# Patient Record
Sex: Male | Born: 1980 | Hispanic: No | Marital: Married
Health system: Southern US, Community
[De-identification: ages and names within clinical notes are randomized; demographics above are authoritative.]

---

## 2003-07-27 ENCOUNTER — Ambulatory Visit (HOSPITAL_COMMUNITY): Admission: RE | Admit: 2003-07-27 | Discharge: 2003-07-27 | Payer: Self-pay | Admitting: Urology

## 2021-02-18 ENCOUNTER — Other Ambulatory Visit: Payer: Self-pay

## 2021-02-18 ENCOUNTER — Emergency Department (HOSPITAL_COMMUNITY)
Admission: EM | Admit: 2021-02-18 | Discharge: 2021-02-19 | Disposition: A | Discharge status: Home / Self Care | Payer: Self-pay | Attending: Emergency Medicine | Admitting: Emergency Medicine

## 2021-02-18 ENCOUNTER — Encounter (HOSPITAL_COMMUNITY): Payer: Self-pay | Admitting: Emergency Medicine

## 2021-02-18 DIAGNOSIS — R1013 Epigastric pain: Secondary | ICD-10-CM | POA: Insufficient documentation

## 2021-02-18 DIAGNOSIS — R0789 Other chest pain: Secondary | ICD-10-CM | POA: Insufficient documentation

## 2021-02-18 DIAGNOSIS — R109 Unspecified abdominal pain: Secondary | ICD-10-CM

## 2021-02-18 DIAGNOSIS — Z20822 Contact with and (suspected) exposure to covid-19: Secondary | ICD-10-CM | POA: Insufficient documentation

## 2021-02-18 LAB — URINALYSIS, ROUTINE W REFLEX MICROSCOPIC
Bilirubin Urine: NEGATIVE
Glucose, UA: NEGATIVE mg/dL
Hgb urine dipstick: NEGATIVE
Ketones, ur: NEGATIVE mg/dL
Leukocytes,Ua: NEGATIVE
Nitrite: NEGATIVE
Protein, ur: NEGATIVE mg/dL
Specific Gravity, Urine: 1.011 (ref 1.005–1.030)
pH: 6 (ref 5.0–8.0)

## 2021-02-18 NOTE — ED Triage Notes (Signed)
Pt c/o right sided flank pain since Friday, worsening today. Denies nausea/vomiting. States he was seen by his doctor on Friday, given meds, with no relief. Denies urinary symptoms.

## 2021-02-19 ENCOUNTER — Emergency Department (HOSPITAL_COMMUNITY): Payer: Self-pay

## 2021-02-19 LAB — CBC WITH DIFFERENTIAL/PLATELET
Abs Immature Granulocytes: 0.04 10*3/uL (ref 0.00–0.07)
Basophils Absolute: 0 10*3/uL (ref 0.0–0.1)
Basophils Relative: 0 %
Eosinophils Absolute: 0 10*3/uL (ref 0.0–0.5)
Eosinophils Relative: 0 %
HCT: 42.3 % (ref 39.0–52.0)
Hemoglobin: 14.3 g/dL (ref 13.0–17.0)
Immature Granulocytes: 0 %
Lymphocytes Relative: 12 %
Lymphs Abs: 1.1 10*3/uL (ref 0.7–4.0)
MCH: 31.1 pg (ref 26.0–34.0)
MCHC: 33.8 g/dL (ref 30.0–36.0)
MCV: 92 fL (ref 80.0–100.0)
Monocytes Absolute: 0.4 10*3/uL (ref 0.1–1.0)
Monocytes Relative: 4 %
Neutro Abs: 7.5 10*3/uL (ref 1.7–7.7)
Neutrophils Relative %: 84 %
Platelets: 222 10*3/uL (ref 150–400)
RBC: 4.6 MIL/uL (ref 4.22–5.81)
RDW: 12.1 % (ref 11.5–15.5)
WBC: 9 10*3/uL (ref 4.0–10.5)
nRBC: 0 % (ref 0.0–0.2)

## 2021-02-19 LAB — COMPREHENSIVE METABOLIC PANEL
ALT: 18 U/L (ref 0–44)
AST: 16 U/L (ref 15–41)
Albumin: 4.2 g/dL (ref 3.5–5.0)
Alkaline Phosphatase: 38 U/L (ref 38–126)
Anion gap: 7 (ref 5–15)
BUN: 13 mg/dL (ref 6–20)
CO2: 24 mmol/L (ref 22–32)
Calcium: 9 mg/dL (ref 8.9–10.3)
Chloride: 105 mmol/L (ref 98–111)
Creatinine, Ser: 0.79 mg/dL (ref 0.61–1.24)
GFR, Estimated: >60 mL/min (ref >60)
Glucose, Bld: 111 mg/dL — ABNORMAL HIGH (ref 70–99)
Potassium: 4.1 mmol/L (ref 3.5–5.1)
Sodium: 136 mmol/L (ref 135–145)
Total Bilirubin: 0.5 mg/dL (ref 0.3–1.2)
Total Protein: 7.2 g/dL (ref 6.5–8.1)

## 2021-02-19 LAB — RESP PANEL BY RT-PCR (FLU A&B, COVID) ARPGX2
Influenza A by PCR: NEGATIVE
Influenza B by PCR: NEGATIVE
SARS Coronavirus 2 by RT PCR: NEGATIVE

## 2021-02-19 LAB — LIPASE, BLOOD: Lipase: 26 U/L (ref 11–51)

## 2021-02-19 MED ORDER — ACETAMINOPHEN 325 MG PO TABS: 650.0000 mg | ORAL_TABLET | Freq: Four times a day (QID) | ORAL | 0 refills | Status: AC | PRN

## 2021-02-19 MED ORDER — IBUPROFEN 600 MG PO TABS: 600.0000 mg | ORAL_TABLET | Freq: Four times a day (QID) | ORAL | 0 refills | Status: AC | PRN

## 2021-02-19 MED ORDER — IOHEXOL 300 MG/ML  SOLN
75.0000 mL | Freq: Once | INTRAMUSCULAR | Status: AC | PRN
  Administered 2021-02-19: 75 mL via INTRAVENOUS

## 2021-02-19 MED ORDER — LIDOCAINE-PRILOCAINE 2.5-2.5 % EX CREA: 1.0000 "application " | TOPICAL_CREAM | Freq: Two times a day (BID) | CUTANEOUS | 0 refills | Status: AC | PRN

## 2021-02-19 MED ORDER — HYDROMORPHONE HCL 1 MG/ML IJ SOLN
1.0000 mg | Freq: Once | INTRAMUSCULAR | Status: AC
Start: 2021-02-19 — End: 2021-02-19
  Administered 2021-02-19: 1 mg via INTRAVENOUS
  Filled 2021-02-19: qty 1

## 2021-02-19 MED ORDER — OXYCODONE HCL 5 MG PO TABS: 5.0000 mg | ORAL_TABLET | Freq: Four times a day (QID) | ORAL | 0 refills | Status: AC | PRN

## 2021-02-19 MED ORDER — HYDROMORPHONE HCL 1 MG/ML IJ SOLN
1.0000 mg | Freq: Once | INTRAMUSCULAR | Status: AC
  Administered 2021-02-19: 1 mg via INTRAVENOUS
  Filled 2021-02-19: qty 1

## 2021-02-19 NOTE — Consult Note (Signed)
Surgical Evaluation Requesting provider: Dr. Renaye Rakers  Chief Complaint: right upper quadrant pain  HPI: Very pleasant and otherwise healthy 40yo man with 3 day history of right upper quadrant/lower rib pain. This began Friday morning and has persisted despite getting some steroid/pain med injections in his hips.  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  Describes it as a sharp crampy pain.  It is located over the right anterior and anterior lateral lower chest wall and along the lower edge of the rib cage on the right side.  Denies any back pain, does not really radiate anywhere else.  Denies any nausea, decreased appetite, change in bowel movements, or exacerbation of the pain with eating.      No Known Allergies  History reviewed. No pertinent past medical history.  History reviewed. No pertinent surgical history.  No family history on file.  Social History   Socioeconomic History  . Marital status: Married    Spouse name: Not on file  . Number of children: Not on file  . Years of education: Not on file  . Highest education level: Not on file  Occupational History  . Not on file  Tobacco Use  . Smoking status: Not on file  . Smokeless tobacco: Not on file  Substance and Sexual Activity  . Alcohol use: Not on file  . Drug use: Not on file  . Sexual activity: Not on file  Other Topics Concern  . Not on file  Social History Narrative  . Not on file   Social Determinants of Health   Financial Resource Strain: Not on file  Food Insecurity: Not on file  Transportation Needs: Not on file  Physical Activity: Not on file  Stress: Not on file  Social Connections: Not on file    No current facility-administered medications on file prior to encounter.   No current outpatient medications on file prior to  encounter.    Review of Systems: a complete, 10pt review of systems was completed with pertinent positives and negatives as documented in the HPI  Physical Exam: Vitals:   02/18/21 2221 02/19/21 0409  BP: 138/90 122/80  Pulse: 66 (!) 52  Resp: 18 16  Temp: 98.5 F (36.9 C) 97.8 F (36.6 C)  SpO2: 98% 99%   Gen: A&Ox3, no distress  Eyes: lids and conjunctivae normal, no icterus. Pupils equally round and reactive to light.  Neck: supple without mass or thyromegaly Chest: respiratory effort is normal. No crepitus or tenderness on palpation of the chest. Breath sounds equal.  Cardiovascular: RRR with palpable distal pulses, no pedal edema Gastrointestinal: soft, nondistended, nontender.  There is moderate tenderness over the lowest ribs and subcostal margin on the right side. no mass, hepatomegaly or splenomegaly. No hernia. Lymphatic: no lymphadenopathy in the neck or groin Muscoloskeletal: no clubbing or cyanosis of the fingers.  Strength is symmetrical throughout.  Range of motion of bilateral upper and lower extremities normal without pain, crepitation or contracture. Neuro: cranial nerves grossly intact.  Sensation intact to light touch diffusely. Psych: appropriate mood and affect, normal insight/judgment intact  Skin: warm and dry   CBC Latest Ref Rng & Units 02/19/2021  WBC 4.0 - 10.5 K/uL 9.0  Hemoglobin 13.0 - 17.0 g/dL 16.1  Hematocrit 09.6 - 52.0 % 42.3  Platelets 150 - 400 K/uL  222    CMP Latest Ref Rng & Units 02/19/2021  Glucose 70 - 99 mg/dL 244(W)  BUN 6 - 20 mg/dL 13  Creatinine 1.02 - 7.25 mg/dL 3.66  Sodium 440 - 347 mmol/L 136  Potassium 3.5 - 5.1 mmol/L 4.1  Chloride 98 - 111 mmol/L 105  CO2 22 - 32 mmol/L 24  Calcium 8.9 - 10.3 mg/dL 9.0  Total Protein 6.5 - 8.1 g/dL 7.2  Total Bilirubin 0.3 - 1.2 mg/dL 0.5  Alkaline Phos 38 - 126 U/L 38  AST 15 - 41 U/L 16  ALT 0 - 44 U/L 18    No results found for: INR, PROTIME  Imaging: CT ABDOMEN PELVIS W  CONTRAST  Result Date: 02/19/2021 CLINICAL DATA:  40 year old male with abdominal pain. EXAM: CT ABDOMEN AND PELVIS WITH CONTRAST TECHNIQUE: Multidetector CT imaging of the abdomen and pelvis was performed using the standard protocol following bolus administration of intravenous contrast. CONTRAST:  2mL OMNIPAQUE IOHEXOL 300 MG/ML  SOLN COMPARISON:  Abdominal ultrasound dated 02/19/2021. FINDINGS: Lower chest: Bibasilar linear atelectasis/scarring. The visualized lung bases are otherwise clear. No intra-abdominal free air or free fluid. Hepatobiliary: No focal liver abnormality is seen. No gallstones, gallbladder wall thickening, or biliary dilatation. Pancreas: Unremarkable. No pancreatic ductal dilatation or surrounding inflammatory changes. Spleen: Normal in size without focal abnormality. Adrenals/Urinary Tract: The adrenal glands, kidneys, visualized ureters, and urinary bladder appear unremarkable Stomach/Bowel: There is no bowel obstruction or active inflammation. The appendix is normal. Vascular/Lymphatic: Mild aortoiliac atherosclerotic disease. The IVC is unremarkable. No portal venous gas. There is no adenopathy. Reproductive: The prostate and seminal vesicles are grossly unremarkable. No pelvic mass. Other: None Musculoskeletal: No acute or significant osseous findings. IMPRESSION: 1. No acute intra-abdominal or pelvic pathology. No bowel obstruction. Normal appendix. 2. Aortic Atherosclerosis (ICD10-I70.0). Electronically Signed   By: Elgie Collard M.D.   On: 02/19/2021 03:43   US Abdomen Limited RUQ (LIVER/GB)  Result Date: 02/19/2021 CLINICAL DATA:  Abnormal pain. Normal white blood cell count. EXAM: ULTRASOUND ABDOMEN LIMITED RIGHT UPPER QUADRANT COMPARISON:  None. FINDINGS: Gallbladder: No gallstones or gallbladder sludge identified. The gallbladder wall measures at the upper limits of normal: 3 mm. No pericholecystic fluid. A positive sonographic Murphy sign noted by sonographer. Common  bile duct: Diameter: 2 mm. Liver: No focal lesion identified. Within normal limits in parenchymal echogenicity. Portal vein is patent on color Doppler imaging with normal direction of blood flow towards the liver. Other: None. IMPRESSION: A positive sonographic Eulah Pont sign was reported by the ultrasound technician with an associated gallbladder wall that measures at the upper limits of normal (3 mm). Findings of unclear etiology. Electronically Signed   By: Tish Frederickson M.D.   On: 02/19/2021 02:09     A/P: 40 year old man with right sided lower chest wall pain and tenderness/right subcostal pain and tenderness.  Normal labs and vital signs, on ultrasound/CT there is no evidence of cholelithiasis or cholecystitis.  He is not a typical candidate for acalculus cholecystitis.  Furthermore his symptoms are atypical for biliary etiology given strong association with movement/position.  I suspect that this is musculoskeletal and would consider further treatment with anti-inflammatories, muscle relaxers, ice, rest and more time.     Phylliss Blakes, MD [facility], [place]  See AMION to contact appropriate on-call provider

## 2021-02-19 NOTE — Discharge Instructions (Signed)
For pain at home, you can begin by taking Tylenol and Motrin as prescribed every 6 hours.  If you have severe pain, you can take an oxycodone every 6 hours.  I also prescribed a lidocaine cream which you can apply twice a day where it hurts on your chest wall.  If this is not available at the pharmacy, you can find 4% lidocaine over-the-counter (eg [employer].)  I think you likely have a strain of the muscles of your chest wall.  This should get better over the next week or two.  Try to avoid any heavy lifting for the next 2 weeks.   Your blood test today were normal.  Your CT scan and ultrasound did not show any signs of inflammation of your liver, gallbladder or pancreas.  I do not see signs of infection in your lungs.  If your pain becomes more severe, or you have difficulty breathing, please return to the emergency department.

## 2021-02-19 NOTE — ED Provider Notes (Signed)
[facility] EMERGENCY DEPARTMENT Provider Note   CSN: [id number] Arrival date & time: 02/18/21  2214     History CC:  Abdominal pain, chest wall pain  Jared May is a 40 y.o. male who reports no significant past medical history present emergency department with abdominal pain.  The patient reports onset of his symptoms when he woke up 4 days ago on Friday.  He states that the day prior to this he was moving heavy furniture around the house, but he denies any trauma or pain at that time.  He has never had this kind of pain before.  He describes a sharp, intense pain in his right upper quadrant it seems to wrap around towards his right flank.  It does not travel anywhere else.  It will wax and wane but never go away completely.  It is worse with deep inspiration.  He denies nausea, vomiting, diarrhea, constipation.  He has had a regular diet.  He denies fevers or chills.  He was seen by his PCP yesterday who obtained a chest x-ray and checked a urine sample, and reports both of these were normal.  He received a shot of pain medication, states this helped a little bit but his pain is returned.  The pain is currently high intensity.  It is worse with laying down.  He denies any history of kidney stones, dysuria, hematuria.  He reports surgical history of left inguinal hernia repair many years ago, with denies any other surgical history.  He reports no drug allergies.  He denies any other medical problems or taking any medications at baseline.  He last ate around noon today.  HPI     History reviewed. No pertinent past medical history.  There are no problems to display for this patient.   History reviewed. No pertinent surgical history.     No family history on file.     Home Medications Prior to Admission medications   Medication Sig Start Date End Date Taking? Authorizing Provider  acetaminophen (TYLENOL) 325 MG tablet Take 2 tablets (650 mg total) by mouth  every 6 (six) hours as needed for up to 30 doses for mild pain or moderate pain. 02/19/21  Yes Lucilia Yanni, Kermit Balo, MD  ibuprofen (ADVIL) 600 MG tablet Take 1 tablet (600 mg total) by mouth every 6 (six) hours as needed for mild pain or moderate pain. 02/19/21 03/21/21 Yes Horace Wishon, Kermit Balo, MD  lidocaine-prilocaine (EMLA) cream Apply 1 application topically 2 (two) times daily as needed for up to 7 days. 02/19/21 02/26/21 Yes Milfred Krammes, Kermit Balo, MD  oxyCODONE (ROXICODONE) 5 MG immediate release tablet Take 1 tablet (5 mg total) by mouth every 6 (six) hours as needed for up to 15 doses for severe pain. 02/19/21  Yes Ziyanna Tolin, Kermit Balo, MD    Allergies    Patient has no known allergies.  Review of Systems   Review of Systems  Constitutional: Negative for chills and fever.  HENT: Negative for ear pain and sore throat.   Eyes: Negative for pain and visual disturbance.  Respiratory: Negative for cough and shortness of breath.   Cardiovascular: Negative for chest pain and palpitations.  Gastrointestinal: Positive for abdominal pain. Negative for constipation, diarrhea, nausea and vomiting.  Genitourinary: Negative for dysuria and hematuria.  Musculoskeletal: Negative for arthralgias and back pain.  Skin: Negative for color change and rash.  Neurological: Negative for syncope and light-headedness.  All other systems reviewed and are negative.   Physical  Exam Updated Vital Signs BP 122/80   Pulse (!) 52   Temp 97.8 F (36.6 C) (Oral)   Resp 16   SpO2 99%   Physical Exam Constitutional:      General: He is not in acute distress. HENT:     Head: Normocephalic and atraumatic.  Eyes:     Conjunctiva/sclera: Conjunctivae normal.     Pupils: Pupils are equal, round, and reactive to light.  Cardiovascular:     Rate and Rhythm: Normal rate and regular rhythm.  Pulmonary:     Effort: Pulmonary effort is normal. No respiratory distress.  Abdominal:     General: There is no distension.      Tenderness: There is abdominal tenderness in the right upper quadrant and epigastric area. There is guarding. There is no rebound. Positive signs include Murphy's sign. Negative signs include Rovsing's sign and McBurney's sign.  Musculoskeletal:     Comments: Right sided lower anterior-lateral intracostral tenderness on exam, worse with movement  Skin:    General: Skin is warm and dry.  Neurological:     General: No focal deficit present.     Mental Status: He is alert. Mental status is at baseline.  Psychiatric:        Mood and Affect: Mood normal.        Behavior: Behavior normal.     ED Results / Procedures / Treatments   Labs (all labs ordered are listed, but only abnormal results are displayed) Labs Reviewed  COMPREHENSIVE METABOLIC PANEL - Abnormal; Notable for the following components:      Result Value   Glucose, Bld 111 (*)    All other components within normal limits  RESP PANEL BY RT-PCR (FLU A&B, COVID) [id number]  URINALYSIS, ROUTINE W REFLEX MICROSCOPIC  LIPASE, BLOOD  CBC WITH DIFFERENTIAL/PLATELET    EKG None  Radiology CT ABDOMEN PELVIS W CONTRAST  Result Date: 02/19/2021 CLINICAL DATA:  40 year old male with abdominal pain. EXAM: CT ABDOMEN AND PELVIS WITH CONTRAST TECHNIQUE: Multidetector CT imaging of the abdomen and pelvis was performed using the standard protocol following bolus administration of intravenous contrast. CONTRAST:  72mL OMNIPAQUE IOHEXOL 300 MG/ML  SOLN COMPARISON:  Abdominal ultrasound dated 02/19/2021. FINDINGS: Lower chest: Bibasilar linear atelectasis/scarring. The visualized lung bases are otherwise clear. No intra-abdominal free air or free fluid. Hepatobiliary: No focal liver abnormality is seen. No gallstones, gallbladder wall thickening, or biliary dilatation. Pancreas: Unremarkable. No pancreatic ductal dilatation or surrounding inflammatory changes. Spleen: Normal in size without focal abnormality. Adrenals/Urinary Tract: The adrenal  glands, kidneys, visualized ureters, and urinary bladder appear unremarkable Stomach/Bowel: There is no bowel obstruction or active inflammation. The appendix is normal. Vascular/Lymphatic: Mild aortoiliac atherosclerotic disease. The IVC is unremarkable. No portal venous gas. There is no adenopathy. Reproductive: The prostate and seminal vesicles are grossly unremarkable. No pelvic mass. Other: None Musculoskeletal: No acute or significant osseous findings. IMPRESSION: 1. No acute intra-abdominal or pelvic pathology. No bowel obstruction. Normal appendix. 2. Aortic Atherosclerosis (ICD10-I70.0). Electronically Signed   By: Elgie Collard M.D.   On: 02/19/2021 03:43   US Abdomen Limited RUQ (LIVER/GB)  Result Date: 02/19/2021 CLINICAL DATA:  Abnormal pain. Normal white blood cell count. EXAM: ULTRASOUND ABDOMEN LIMITED RIGHT UPPER QUADRANT COMPARISON:  None. FINDINGS: Gallbladder: No gallstones or gallbladder sludge identified. The gallbladder wall measures at the upper limits of normal: 3 mm. No pericholecystic fluid. A positive sonographic Murphy sign noted by sonographer. Common bile duct: Diameter: 2 mm. Liver: No focal lesion identified. Within  normal limits in parenchymal echogenicity. Portal vein is patent on color Doppler imaging with normal direction of blood flow towards the liver. Other: None. IMPRESSION: A positive sonographic Eulah PontMurphy sign was reported by the ultrasound technician with an associated gallbladder wall that measures at the upper limits of normal (3 mm). Findings of unclear etiology. Electronically Signed   By: Tish FredericksonMorgane  Naveau M.D.   On: 02/19/2021 02:09    Procedures Procedures   Medications Ordered in ED Medications  HYDROmorphone (DILAUDID) injection 1 mg (1 mg Intravenous Given 02/19/21 0109)  iohexol (OMNIPAQUE) 300 MG/ML solution 75 mL (75 mLs Intravenous Contrast Given 02/19/21 0335)  HYDROmorphone (DILAUDID) injection 1 mg (1 mg Intravenous Given 02/19/21 0449)    ED  Course  I have reviewed the triage vital signs and the nursing notes.  Pertinent labs & imaging results that were available during my care of the patient were reviewed by me and considered in my medical decision making (see chart for details).  This patient presents to the Emergency Department with complaint of abdominal pain and chest wall pain. This involves an extensive number of treatment options, and is a complaint that carries with it a high risk of complications and morbidity.  The differential diagnosis includes, but is not limited to, costochondritis vs occult rib fx vs PNA vs pleuritis vs gastritis vs biliary disease vs peptic ulcer vs constipation vs colitis vs UTI vs other  Based on his clinical presentation and exam, I suspect this may be either costochondritis or muscle strain related to him moving heavy furniture the day before, however he does have focal tenderness in the right upper quadrant positive Murphy sign raising concern for acute biliary disease.   We will proceed with a right upper quadrant ultrasound and ordered labs as below.  I doubt pneumonia or pneumothorax.  I reviewed medical records showing his chest x-ray obtained 02/14/21 with no focal findings.  I ordered, reviewed, and interpreted labs.  UA with no sign of blood or infection.  CMP, CBC, Lipase, covid/flu swab unremarkable. I ordered medication IV dilaudid for abdominal pain and/or nausea I ordered imaging studies which included RUQ ultrasound I independently visualized and interpreted imaging which showed no focal stigmata of acute biliary disease.  Gallbladder wall thickness of 3 mm is normal for his age.  There was report of sonographic Murphy sign.     Clinical Course as of 02/19/21 0543  Wed Feb 19, 2021  0146 On my reassessment his pain has significantly improved with the IV Dilaudid. [MT]  0201 Liver function test, lipase are normal.  White blood cell count is normal. [MT]  0240 I discussed with the  patient and his wife at the bedside his ultrasound findings, which are equivocal, but still raise concern for early cholecystitis or biliary colic.  We discussed proceeding with CT scan to better visualize the right kidney, right lower ribs, gallbladder and liver.  They are in agreement.  His pain is still improved, but has not gone away completely with the Dilaudid. [MT]  0349 Ct largely unremarkable.  I've paged general surgery regarding evaluation for possible persistent biliary colic [MT]  0432 Pt  [MT]  [id number] Patient was assessed by Dr. Fredricka Bonineonnor from gen surg and reassessed by myself.  We both agree that this is most likely musculoskeletal pain of the right lower chest wall, as the point associated with the maximal tenderness appears to be the intercostal muscle of the right lower rib line.  Given that there is no  other indication for acute biliary disease, I think it be reasonable to treat him for muscle strain or injury.  Will prescribe medications and discharge.  The patient and his wife verbalized agreement understanding with the plan. [MT]    Clinical Course User Index [MT] Terald Sleeper, MD   Final Clinical Impression(s) / ED Diagnoses Final diagnoses:  Abdominal pain  Chest wall pain    Rx / DC Orders ED Discharge Orders         Ordered    ibuprofen (ADVIL) 600 MG tablet  Every 6 hours PRN        02/19/21 0438    acetaminophen (TYLENOL) 325 MG tablet  Every 6 hours PRN        02/19/21 0438    oxyCODONE (ROXICODONE) 5 MG immediate release tablet  Every 6 hours PRN        02/19/21 0438    lidocaine-prilocaine (EMLA) cream  2 times daily PRN       Note to Pharmacy: If not available, can offer 4% over the counter lidocaine cream as alternative (eg. [employer] or other)   02/19/21 6811           Terald Sleeper, MD 02/19/21 [phone]

## 2021-02-19 NOTE — ED Notes (Signed)
Patient verbalizes understanding of discharge instructions. Opportunity for questioning and answers were provided. Armband removed by staff, pt discharged from ED via wheelchair to lobby to return home with significant other.   

## 2022-02-15 IMAGING — CT CT ABD-PELV W/ CM
2 of 4 series · 16 of 46 positions shown, 18 images · IV contrast (Omni 300)
Comparison: Abdominal ultrasound dated 02/19/2021.

CLINICAL DATA: 40-year-old male with abdominal pain.

EXAM:
CT ABDOMEN AND PELVIS WITH CONTRAST
TECHNIQUE: Multidetector CT imaging of the abdomen and pelvis was performed
using the standard protocol following bolus administration of
intravenous contrast.
CONTRAST:  75mL OMNIPAQUE IOHEXOL 300 MG/ML  SOLN

[Series 4: a/p w/ 5mm · axial · 0.91mm/px · z∈[-510,-85]mm · 13 of 93 slices shown, 15 images]
[im 4/93  soft-tissue]
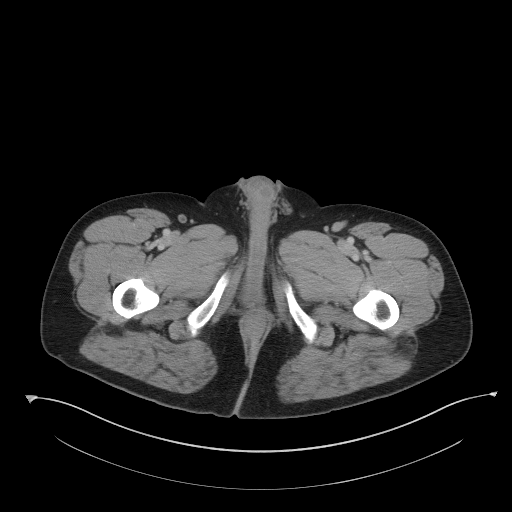
[im 4/93  bone]
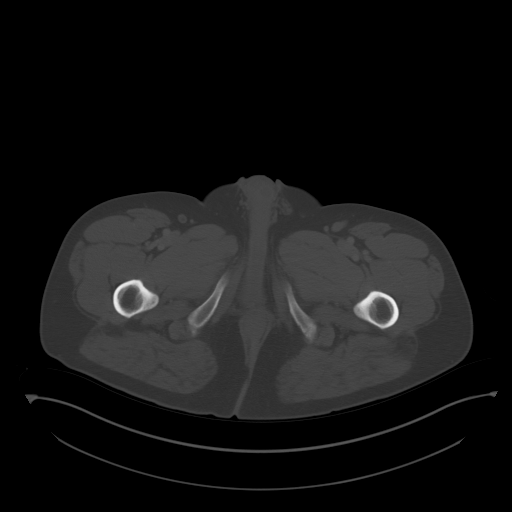
[im 12/93  soft-tissue]
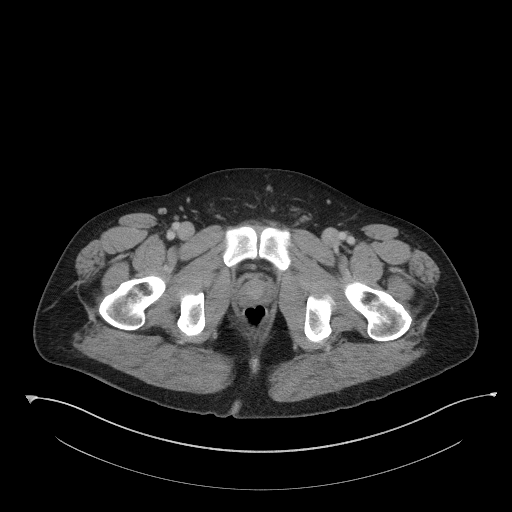
[im 20/93  soft-tissue]
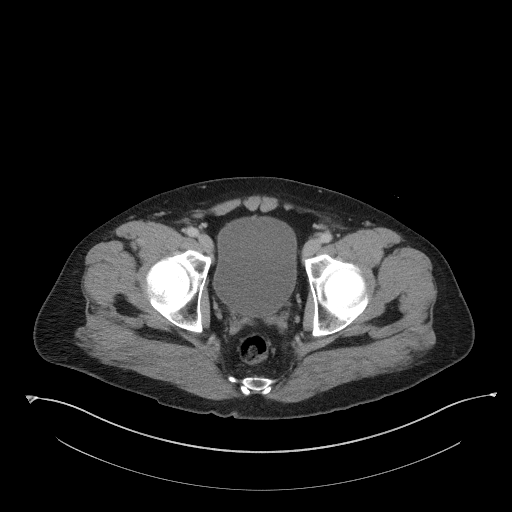
[im 27/93  soft-tissue]
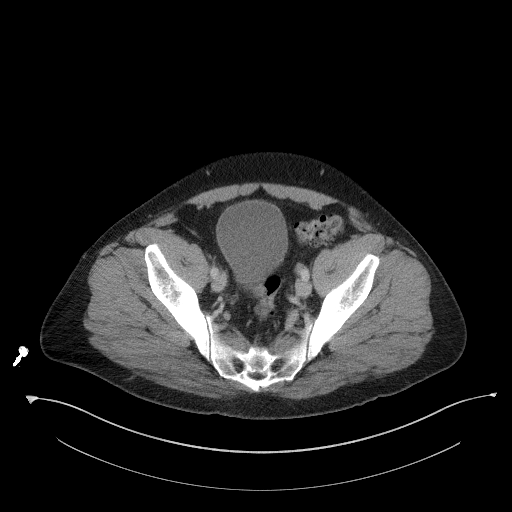
[im 31/93  soft-tissue]
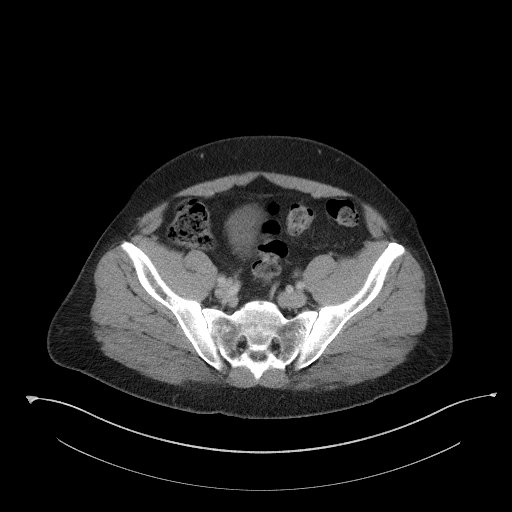
[im 39/93  soft-tissue]
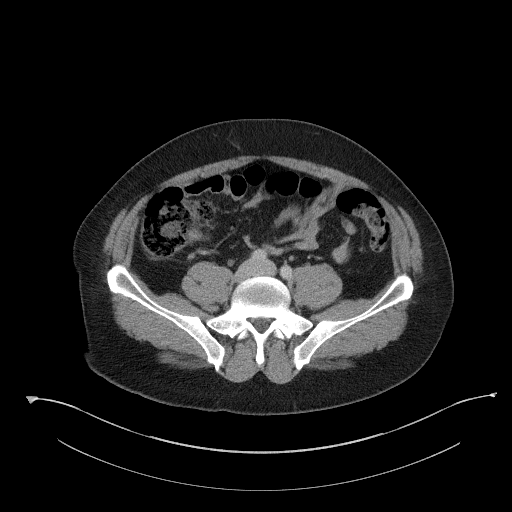
[im 47/93  soft-tissue]
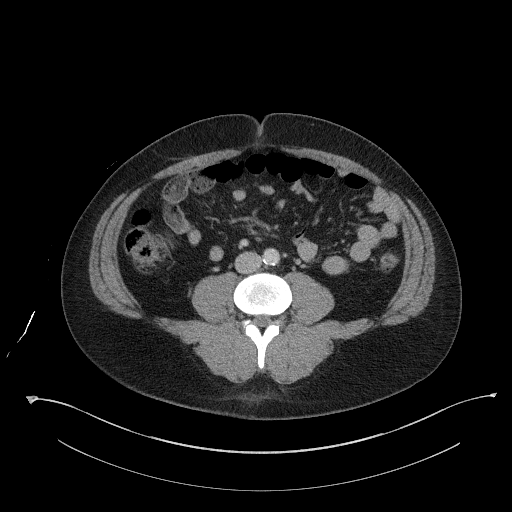
[im 54/93  soft-tissue]
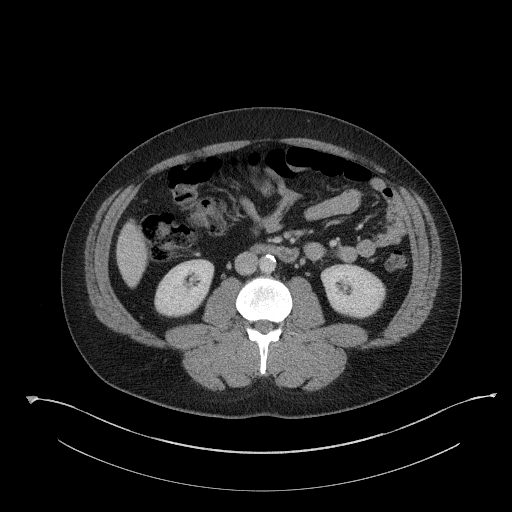
[im 62/93  soft-tissue]
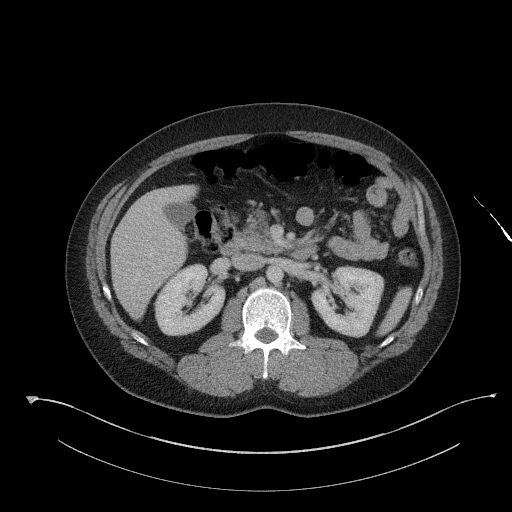
[im 62/93  bone]
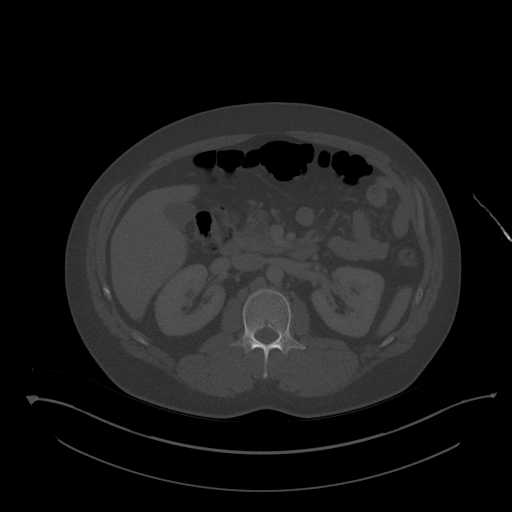
[im 66/93  soft-tissue]
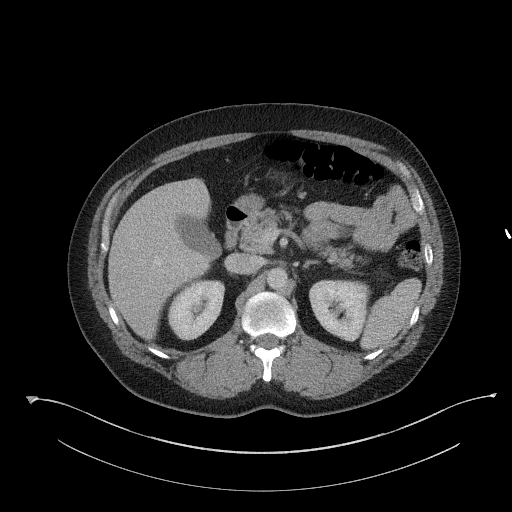
[im 73/93  soft-tissue]
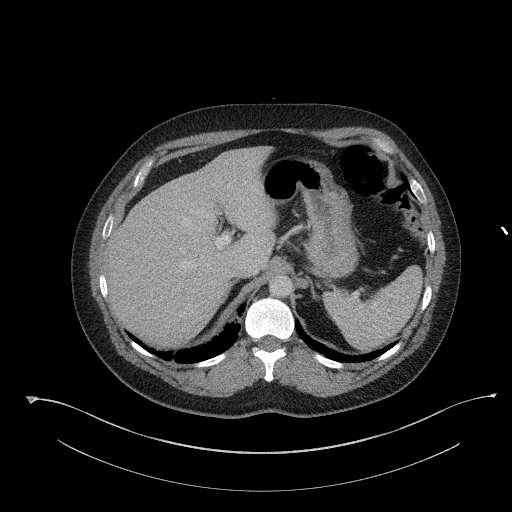
[im 81/93  soft-tissue]
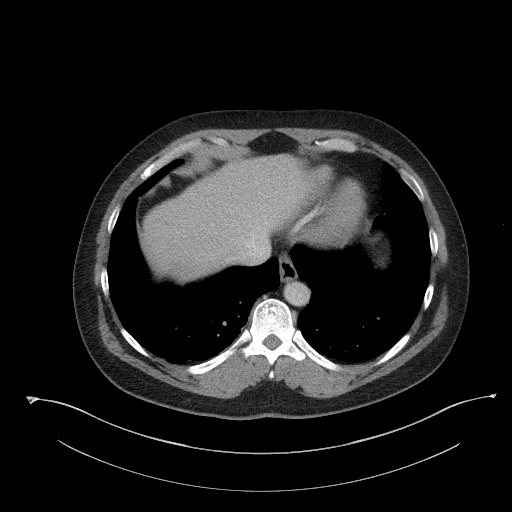
[im 89/93  soft-tissue]
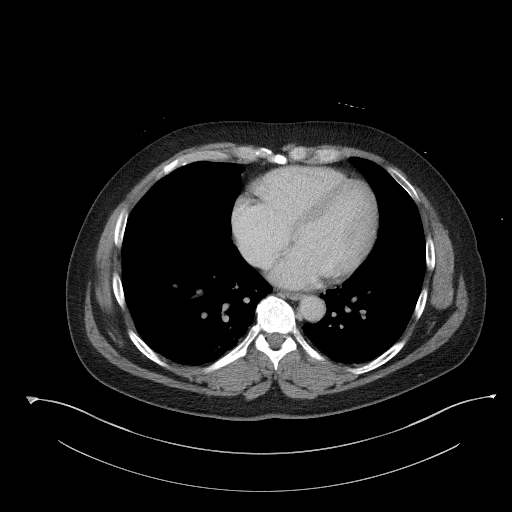

[Series 7: a/p w/ cor · coronal · 0.77mm/px · 3 of 151 slices shown]
[im 51/151  soft-tissue]
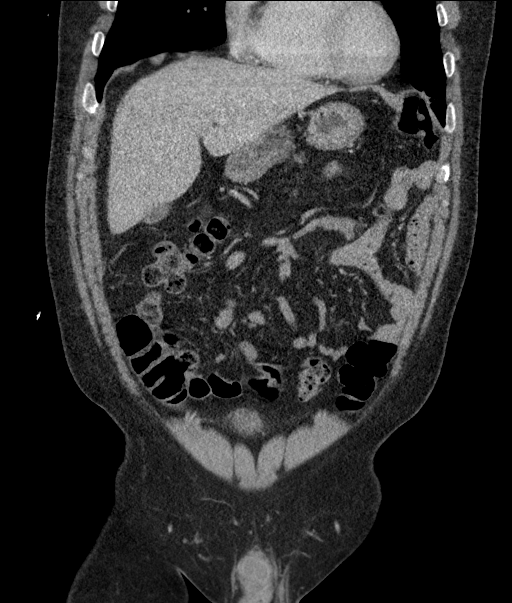
[im 67/151  soft-tissue]
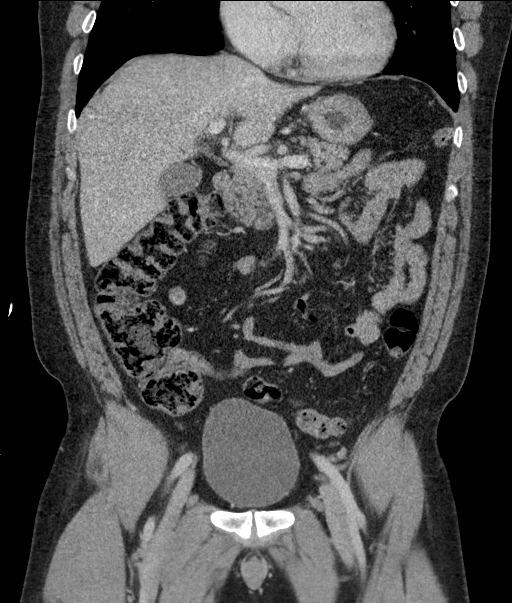
[im 84/151  soft-tissue]
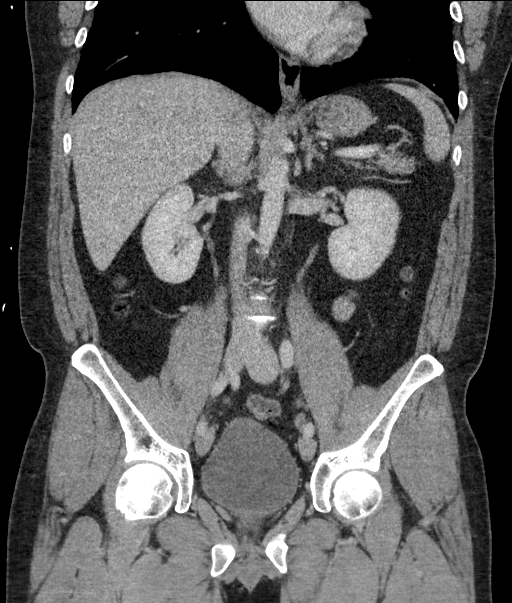

[16 of 46 positions shown; findings below may reference images not displayed]

FINDINGS: Lower chest: Bibasilar linear atelectasis/scarring. The visualized
lung bases are otherwise clear.

No intra-abdominal free air or free fluid.

Hepatobiliary: No focal liver abnormality is seen. No gallstones,
gallbladder wall thickening, or biliary dilatation.

Pancreas: Unremarkable. No pancreatic ductal dilatation or
surrounding inflammatory changes.

Spleen: Normal in size without focal abnormality.

Adrenals/Urinary Tract: The adrenal glands, kidneys, visualized
ureters, and urinary bladder appear unremarkable

Stomach/Bowel: There is no bowel obstruction or active inflammation.
The appendix is normal.

Vascular/Lymphatic: Mild aortoiliac atherosclerotic disease. The IVC
is unremarkable. No portal venous gas. There is no adenopathy.

Reproductive: The prostate and seminal vesicles are grossly
unremarkable. No pelvic mass.

Other: None

Musculoskeletal: No acute or significant osseous findings.
IMPRESSION: 1. No acute intra-abdominal or pelvic pathology. No bowel
obstruction. Normal appendix.
2. Aortic Atherosclerosis ([id number]).
